# Patient Record
Sex: Female | Born: 1985 | Race: White | Hispanic: No | Marital: Married
Health system: Southern US, Community
[De-identification: ages and names within clinical notes are randomized; demographics above are authoritative.]

---
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
## 2011-09-01 ENCOUNTER — Emergency Department (INDEPENDENT_AMBULATORY_CARE_PROVIDER_SITE_OTHER): Payer: Worker's Compensation

## 2011-09-01 ENCOUNTER — Emergency Department (INDEPENDENT_AMBULATORY_CARE_PROVIDER_SITE_OTHER)
Admission: EM | Admit: 2011-09-01 | Discharge: 2011-09-01 | Disposition: A | Discharge status: Home / Self Care | Payer: Worker's Compensation | Source: Home / Self Care | Attending: Family Medicine | Admitting: Family Medicine

## 2011-09-01 ENCOUNTER — Encounter (HOSPITAL_COMMUNITY): Payer: Self-pay | Admitting: *Deleted

## 2011-09-01 DIAGNOSIS — S93409A Sprain of unspecified ligament of unspecified ankle, initial encounter: Secondary | ICD-10-CM

## 2011-09-01 DIAGNOSIS — S96819A Strain of other specified muscles and tendons at ankle and foot level, unspecified foot, initial encounter: Secondary | ICD-10-CM

## 2011-09-01 MED ORDER — IBUPROFEN 600 MG PO TABS: 600.0000 mg | ORAL_TABLET | Freq: Three times a day (TID) | ORAL | Status: AC

## 2011-09-01 MED ORDER — TRAMADOL HCL 50 MG PO TABS: 50.0000 mg | ORAL_TABLET | Freq: Four times a day (QID) | ORAL | Status: AC | PRN

## 2011-09-01 NOTE — Discharge Instructions (Signed)
We will contact you if any abnormal results in your x-rays that require different treatment. Also staff from occupational health would have access to the x-ray records during your visit tomorrow if needed. Take the prescribed medication as instructed. Take ibuprofen with food as it can upset your stomach. Be aware that tramadol can make you drowsy and he should not take before driving. Keep ankle bracing place onto pain resolves. Start doing rehabilitation exercises as soon as pain improves can follow handout instructions. Followup with occupational health for workman comp evaluation tomorrow.

## 2011-09-01 NOTE — ED Provider Notes (Signed)
History     CSN: [id number]  Arrival date & time 09/01/11  2130   First MD Initiated Contact with Patient 09/01/11 1910      Chief Complaint  Patient presents with  . Ankle Pain    (Consider location/radiation/quality/duration/timing/severity/associated sxs/prior treatment) HPI Comments: 26 year old female with no significant past medical history. [employer], [employer]/film/video. Comments complaining of ankle pain. 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 Reports discomfort with putting weight on the left food. No swelling or bruising no lacerations. Denies hitting any other body areas or falling to the ground.   History reviewed. No pertinent past medical history.  History reviewed. No pertinent past surgical history.  Family History  Problem Relation Age of Onset  . Cancer Other     History  Substance Use Topics  . Smoking status: Not on file  . Smokeless tobacco: Not on file  . Alcohol Use: Yes    OB History    Grav Para Term Preterm Abortions TAB SAB Ect Mult Living                  Review of Systems  Constitutional:       10 systems reviewed and  pertinent negative and positive symptoms and as per HPI.     Musculoskeletal:       Left ankle pain as per HPI.  All other systems reviewed and are negative.    Allergies  Review of patient's allergies indicates no known allergies.  Home Medications   Current Outpatient Rx  Name Route Sig Dispense Refill  . ESCITALOPRAM OXALATE 10 MG PO TABS Oral Take 10 mg by mouth daily.    Marland Kitchen LISDEXAMFETAMINE DIMESYLATE 70 MG PO CAPS Oral Take 70 mg by mouth every morning.    Azzie Roup ACE-ETH ESTRAD-FE 1-20 MG-MCG PO TABS Oral Take 1 tablet by mouth daily.    . IBUPROFEN 600 MG PO TABS Oral Take 1 tablet (600 mg total) by mouth 3 (three) times daily. 30 tablet 0  . TRAMADOL HCL 50 MG PO TABS Oral Take 1 tablet (50 mg total) by mouth every 6 (six) hours as  needed for pain. 15 tablet 0    BP 118/80  Pulse 70  Temp(Src) 98.6 F (37 C) (Oral)  Resp 18  SpO2 100%  LMP 08/27/2011  Physical Exam  Nursing note and vitals reviewed. Constitutional: She is oriented to person, place, and time. She appears well-developed and well-nourished. No distress.  HENT:  Head: Normocephalic and atraumatic.  Cardiovascular: Normal heart sounds.   Pulmonary/Chest: Breath sounds normal.  Musculoskeletal:       Left foot: weight bearing but reported discomfort with walking. Tenderness with palpation in lateral ankle anterior to lateral malleoli and dorsomedial area of foot. No bruising or swelling.  No ankle laxity. Able to dorsiflex and extend with no obvious discomfort.  No focal tenderness bruising or swelling over tibial bone. Left lower extremity neurovascularly intact.  Neurological: She is alert and oriented to person, place, and time.  Skin:        No bruising hematomas or lacerations. The is very mild small superficial not contused abrasion in dorsal medial area of left foot.    ED Course  Procedures (including critical care time)  Labs Reviewed - No data to display No results found.   1. Ankle sprain and strain, left, initial encounter       MDM  Impress mild ankle sprain. Patient was placed on an ankle brace. Rehabilitation exercises discussed and provided by writing. Prescribed ibuprofen and tramadol. Followup with occupational health tomorrow.       Sharin Grave, MD 09/02/11 [phone]

## 2011-09-01 NOTE — ED Notes (Signed)
Pt    Reports    She  Larey Seat      Today    While      Walking to   Work         This am   She  Reports  She   Injured  Her l  Ankle         She  Reports pain on weight  Bearing         No  Obvious  Deformity

## 2012-09-25 IMAGING — CR DG ANKLE COMPLETE 3+V*L*
4 series · 4 of 4 positions shown · non-contrast
Comparison: None.

CLINICAL DATA: Medial left ankle pain after rolling injury.

LEFT ANKLE COMPLETE - 3+ VIEW

[view not recorded (1 of 4)]
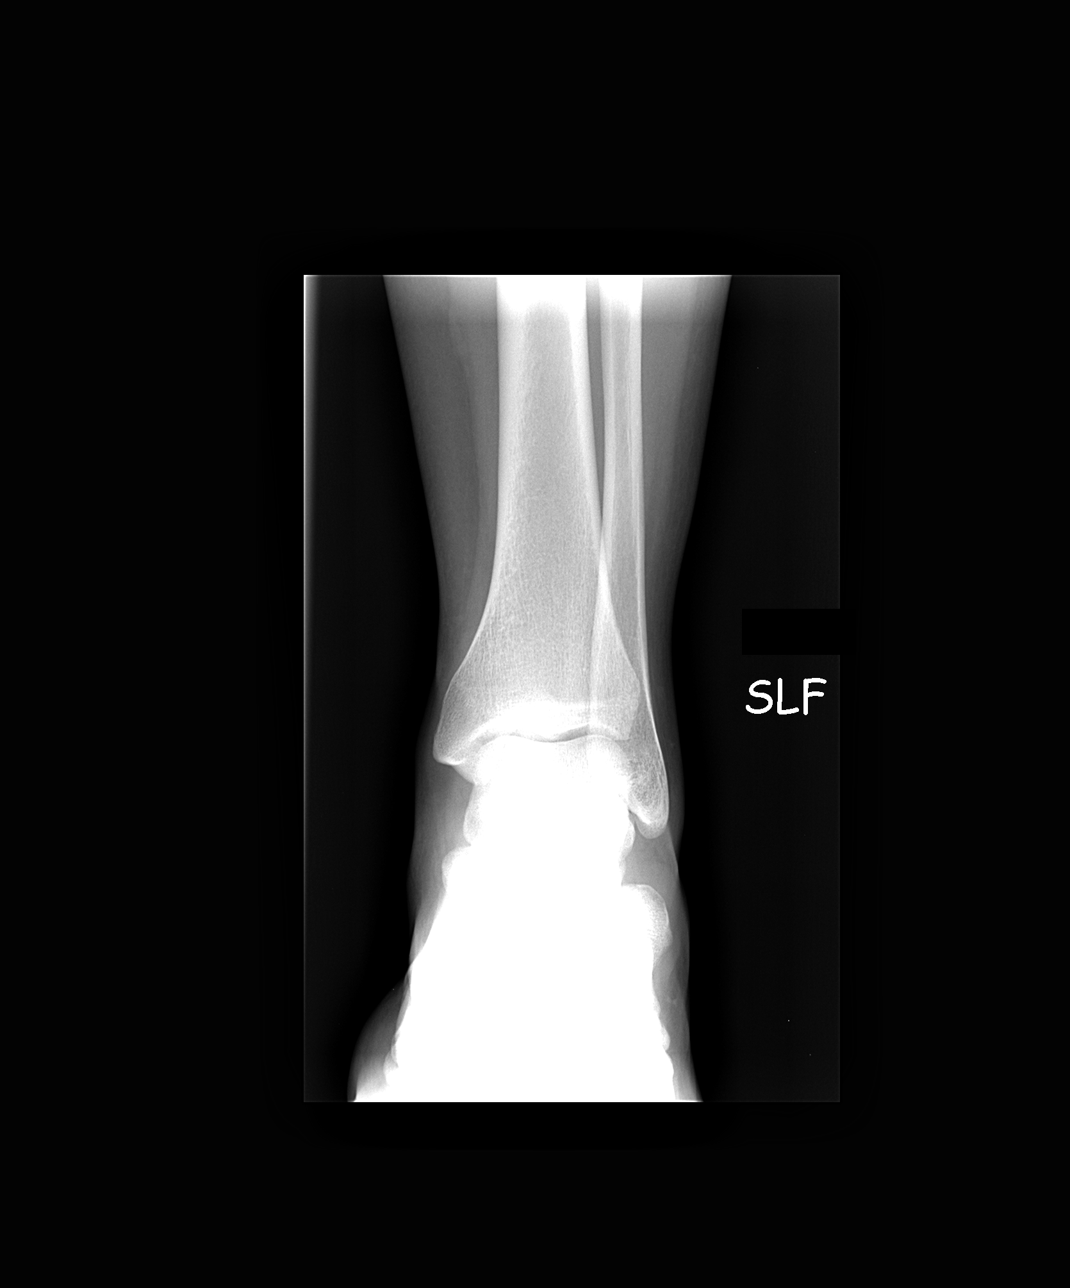

[view not recorded (2 of 4)]
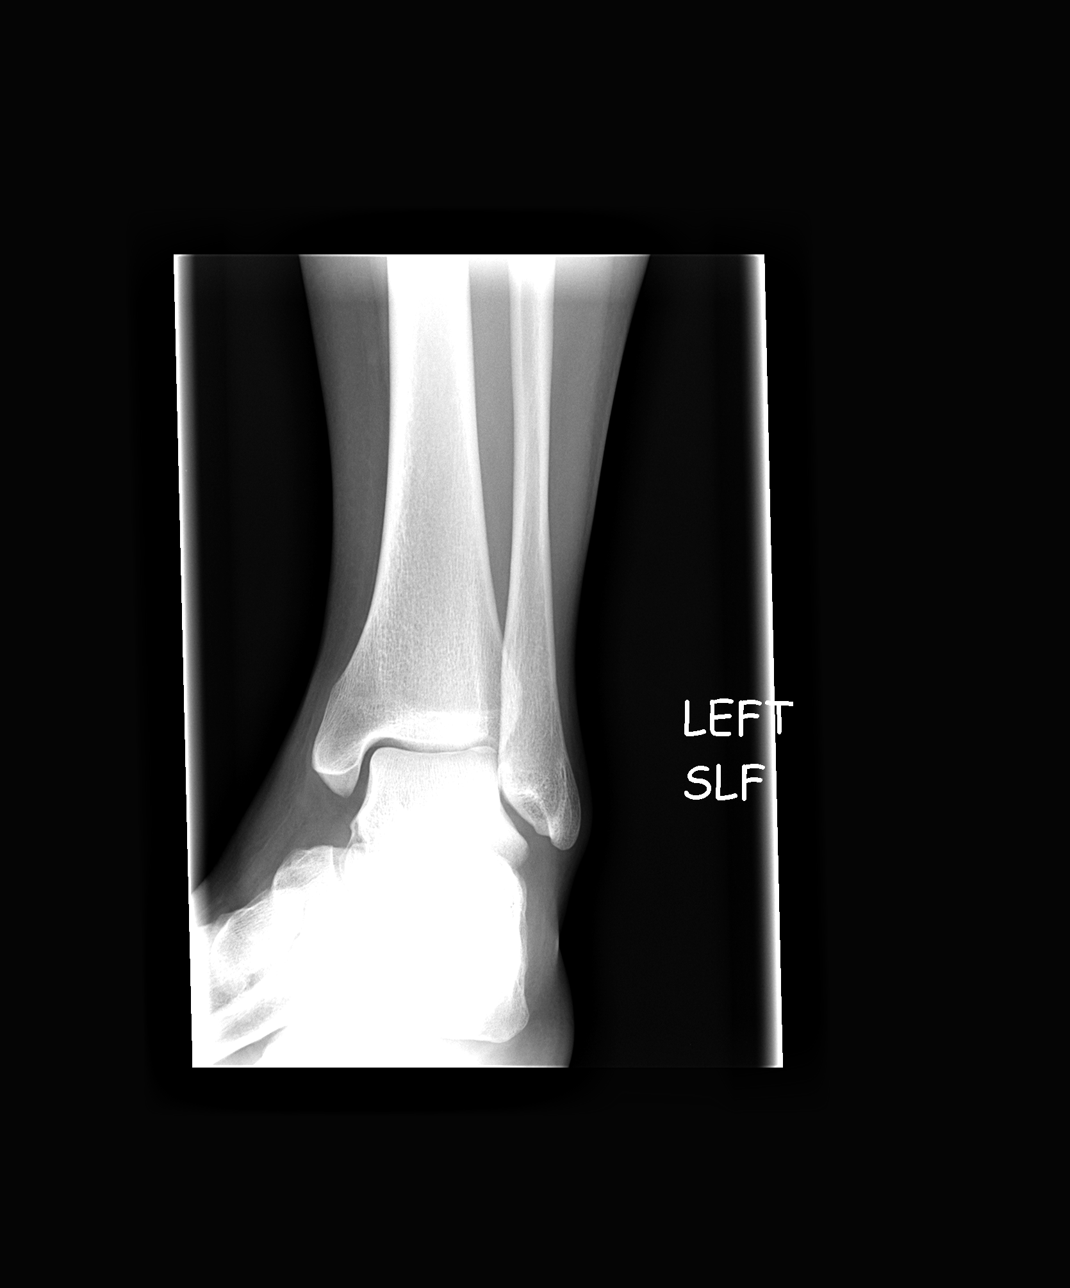

[view not recorded (3 of 4)]
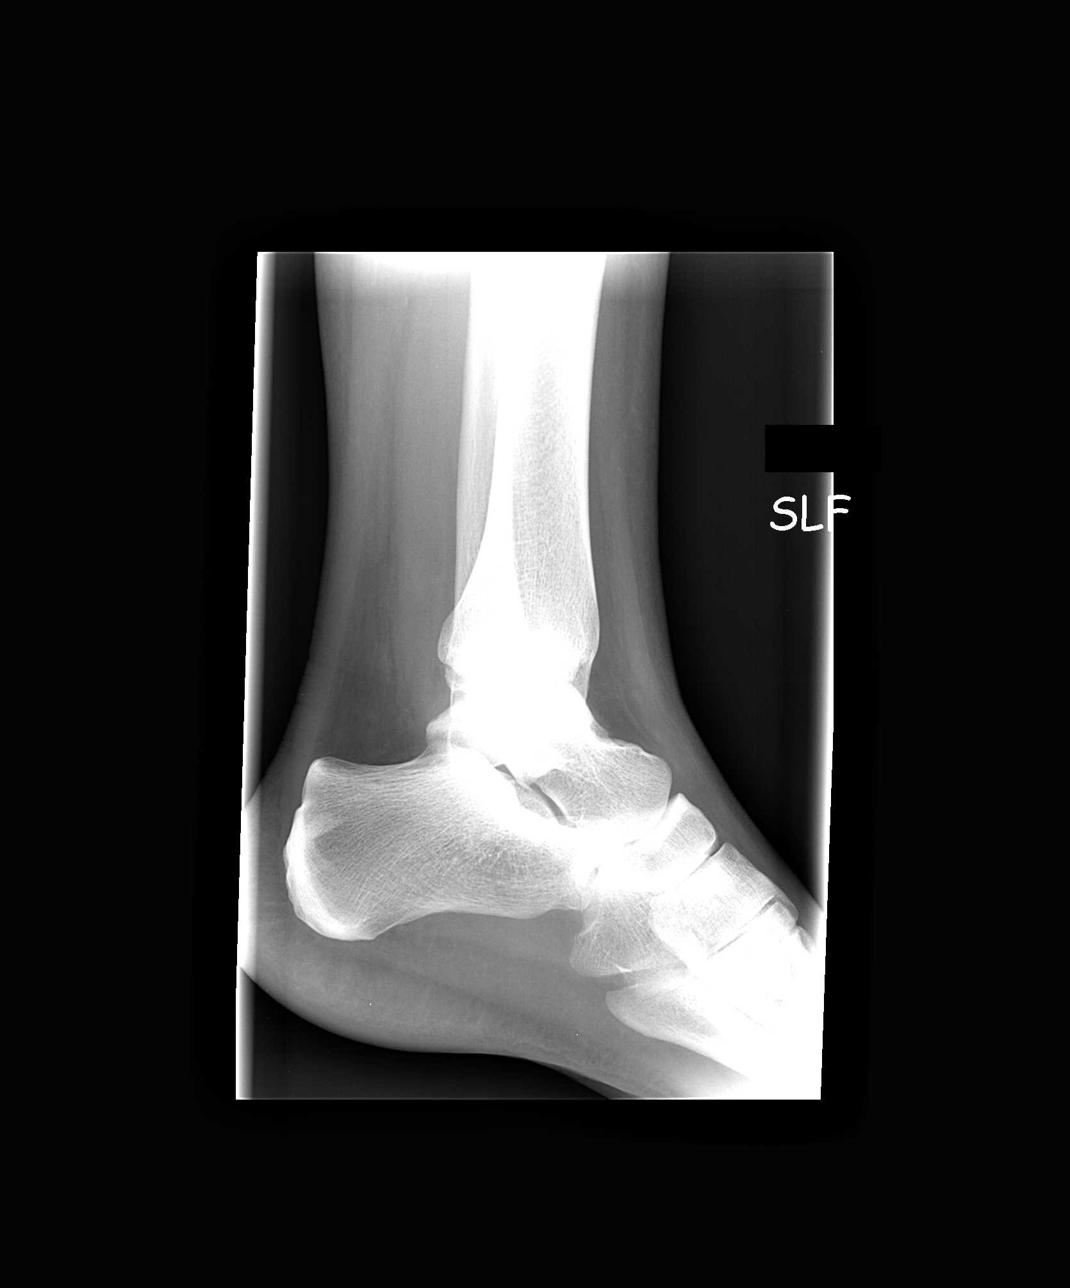

[view not recorded (4 of 4)]
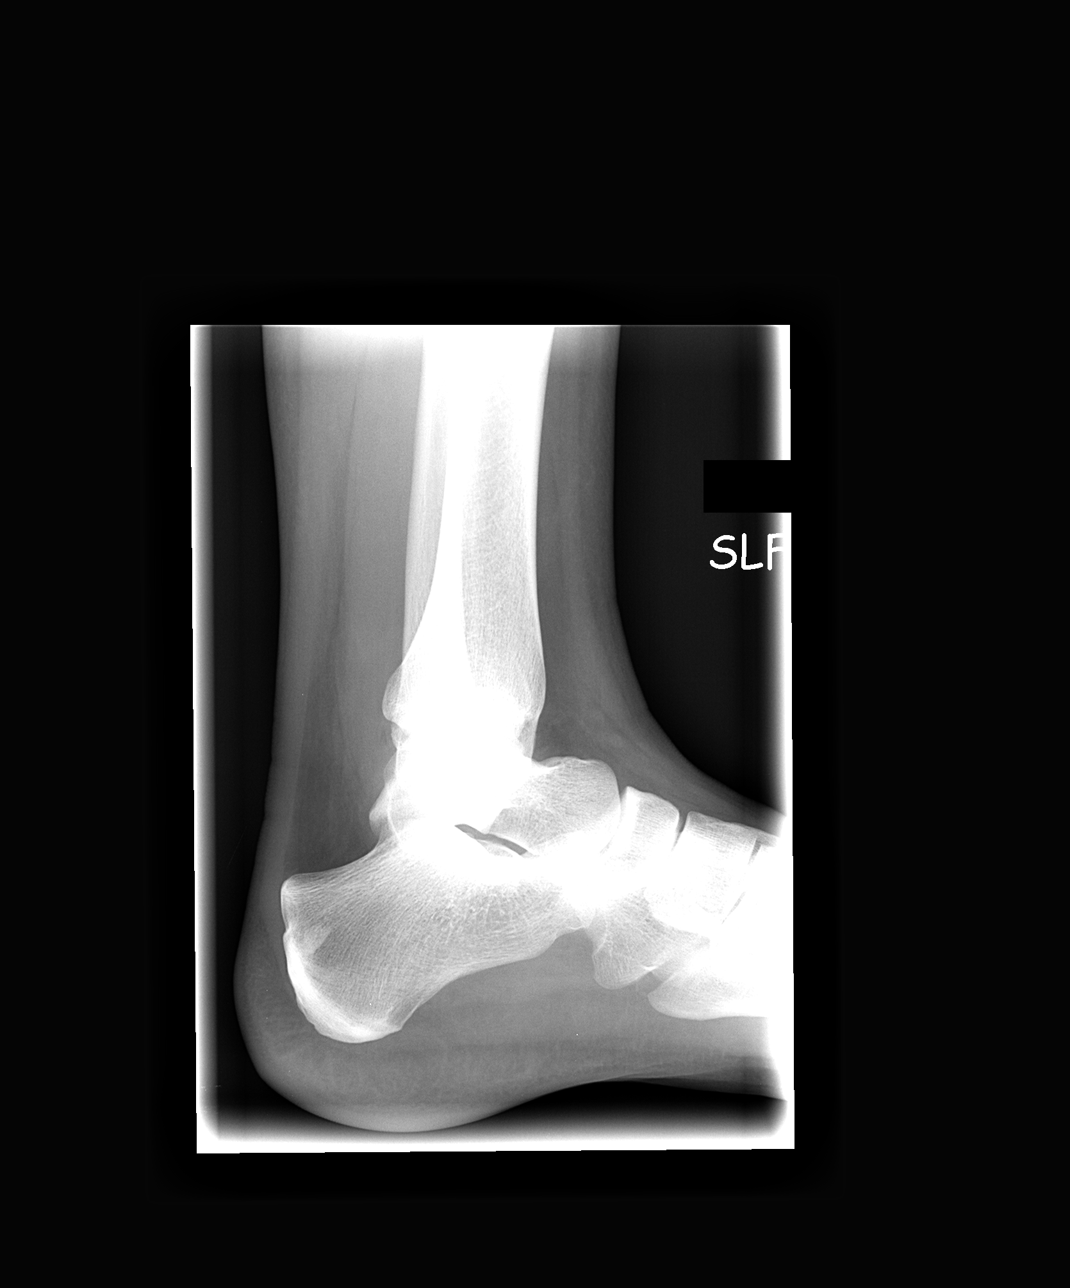

[4 of 4 positions shown; findings below may reference images not displayed]

FINDINGS: The left ankle appears intact.  No evidence of acute
fracture or subluxation.  Focal benign-appearing sclerosis of the
calcaneus.  Ankle mortis and talar dome appear intact.  No focal
bone destruction.  No abnormal periosteal reaction.  No radiopaque
soft tissue foreign bodies.
IMPRESSION: No acute bony abnormalities demonstrated.

## 2018-11-02 ENCOUNTER — Other Ambulatory Visit: Payer: Self-pay | Admitting: Internal Medicine

## 2018-11-02 DIAGNOSIS — Z20822 Contact with and (suspected) exposure to covid-19: Secondary | ICD-10-CM

## 2018-11-07 LAB — NOVEL CORONAVIRUS, NAA: SARS-CoV-2, NAA: NOT DETECTED

## 2019-06-14 ENCOUNTER — Ambulatory Visit: Payer: Self-pay | Attending: Family

## 2019-06-14 DIAGNOSIS — Z23 Encounter for immunization: Secondary | ICD-10-CM | POA: Insufficient documentation

## 2019-06-14 NOTE — Progress Notes (Signed)
   Covid-19 Vaccination Clinic  Name:  Alexis Spencer    MRN: [id number] DOB: 12/02/1985  06/14/2019  Ms. Newcomer was observed post Covid-19 immunization for 15 minutes without incidence. She was provided with Vaccine Information Sheet and instruction to access the V-Safe system.   Ms. Baldonado was instructed to call 911 with any severe reactions post vaccine: Marland Kitchen Difficulty breathing  . Swelling of your face and throat  . A fast heartbeat  . A bad rash all over your body  . Dizziness and weakness    Immunizations Administered    Name Date Dose VIS Date Route   Moderna COVID-19 Vaccine 06/14/2019  1:06 PM 0.5 mL 03/20/2019 Intramuscular   Manufacturer: Moderna   Lot: [id number]   NDC: 77824-235-36

## 2019-07-17 ENCOUNTER — Ambulatory Visit: Payer: Self-pay | Attending: Family

## 2019-07-17 DIAGNOSIS — Z23 Encounter for immunization: Secondary | ICD-10-CM

## 2019-07-17 NOTE — Progress Notes (Signed)
   Covid-19 Vaccination Clinic  Name:  Alexis Spencer    MRN: [id number] DOB: 09-May-1985  07/17/2019  [name] was observed post Covid-19 immunization for 15 minutes without incident. She was provided with Vaccine Information Sheet and instruction to access the V-Safe system.   [name] was instructed to call 911 with any severe reactions post vaccine: Marland Kitchen Difficulty breathing  . Swelling of face and throat  . A fast heartbeat  . A bad rash all over body  . Dizziness and weakness   Immunizations Administered    Name Date Dose VIS Date Route   Moderna COVID-19 Vaccine 07/17/2019 11:59 AM 0.5 mL 03/20/2019 Intramuscular   Manufacturer: Moderna   Lot: [id number]   NDC: 54360-677-03

## 2020-01-09 ENCOUNTER — Other Ambulatory Visit: Payer: Self-pay | Admitting: *Deleted

## 2020-01-09 ENCOUNTER — Other Ambulatory Visit: Payer: BC Managed Care – PPO

## 2020-01-09 DIAGNOSIS — Z20822 Contact with and (suspected) exposure to covid-19: Secondary | ICD-10-CM

## 2020-01-11 LAB — NOVEL CORONAVIRUS, NAA: SARS-CoV-2, NAA: NOT DETECTED

## 2020-01-11 LAB — SARS-COV-2, NAA 2 DAY TAT
